# Patient Record
Sex: Female | Born: 1975 | Race: White | Hispanic: No | Marital: Married | Smoking: Never smoker
Health system: Southern US, Community
[De-identification: ages and names within clinical notes are randomized; demographics above are authoritative.]

## PROBLEM LIST (undated history)

## (undated) DIAGNOSIS — K900 Celiac disease: Secondary | ICD-10-CM

## (undated) DIAGNOSIS — T7840XA Allergy, unspecified, initial encounter: Secondary | ICD-10-CM

## (undated) DIAGNOSIS — R000 Tachycardia, unspecified: Secondary | ICD-10-CM

## (undated) HISTORY — DX: Tachycardia, unspecified: R00.0

## (undated) HISTORY — DX: Allergy, unspecified, initial encounter: T78.40XA

## (undated) HISTORY — DX: Celiac disease: K90.0

---

## 2006-09-16 ENCOUNTER — Other Ambulatory Visit: Admission: RE | Admit: 2006-09-16 | Discharge: 2006-09-16 | Payer: Self-pay | Admitting: Obstetrics and Gynecology

## 2007-09-19 ENCOUNTER — Other Ambulatory Visit: Admission: RE | Admit: 2007-09-19 | Discharge: 2007-09-19 | Payer: Self-pay | Admitting: Obstetrics and Gynecology

## 2008-09-24 ENCOUNTER — Other Ambulatory Visit: Admission: RE | Admit: 2008-09-24 | Discharge: 2008-09-24 | Payer: Self-pay | Admitting: Obstetrics and Gynecology

## 2009-10-11 ENCOUNTER — Other Ambulatory Visit: Admission: RE | Admit: 2009-10-11 | Discharge: 2009-10-11 | Payer: Self-pay | Admitting: Obstetrics and Gynecology

## 2010-10-13 ENCOUNTER — Other Ambulatory Visit: Admission: RE | Admit: 2010-10-13 | Discharge: 2010-10-13 | Payer: Self-pay | Admitting: Obstetrics and Gynecology

## 2011-10-15 ENCOUNTER — Other Ambulatory Visit (HOSPITAL_COMMUNITY)
Admission: RE | Admit: 2011-10-15 | Discharge: 2011-10-15 | Disposition: A | Discharge status: Home / Self Care | Payer: BC Managed Care – PPO | Source: Ambulatory Visit | Attending: Obstetrics and Gynecology | Admitting: Obstetrics and Gynecology

## 2011-10-15 ENCOUNTER — Other Ambulatory Visit: Payer: Self-pay | Admitting: Obstetrics and Gynecology

## 2011-10-15 DIAGNOSIS — Z01419 Encounter for gynecological examination (general) (routine) without abnormal findings: Secondary | ICD-10-CM | POA: Insufficient documentation

## 2012-10-18 ENCOUNTER — Other Ambulatory Visit (HOSPITAL_COMMUNITY)
Admission: RE | Admit: 2012-10-18 | Discharge: 2012-10-18 | Disposition: A | Discharge status: Home / Self Care | Payer: BC Managed Care – PPO | Source: Ambulatory Visit | Attending: Obstetrics and Gynecology | Admitting: Obstetrics and Gynecology

## 2012-10-18 ENCOUNTER — Other Ambulatory Visit: Payer: Self-pay | Admitting: Obstetrics and Gynecology

## 2012-10-18 DIAGNOSIS — Z01419 Encounter for gynecological examination (general) (routine) without abnormal findings: Secondary | ICD-10-CM | POA: Insufficient documentation

## 2013-11-17 ENCOUNTER — Other Ambulatory Visit (HOSPITAL_COMMUNITY)
Admission: RE | Admit: 2013-11-17 | Discharge: 2013-11-17 | Disposition: A | Discharge status: Home / Self Care | Payer: BC Managed Care – PPO | Source: Ambulatory Visit | Attending: Obstetrics and Gynecology | Admitting: Obstetrics and Gynecology

## 2013-11-17 ENCOUNTER — Other Ambulatory Visit: Payer: Self-pay | Admitting: Obstetrics and Gynecology

## 2013-11-17 DIAGNOSIS — Z01419 Encounter for gynecological examination (general) (routine) without abnormal findings: Secondary | ICD-10-CM | POA: Insufficient documentation

## 2013-11-17 DIAGNOSIS — Z1151 Encounter for screening for human papillomavirus (HPV): Secondary | ICD-10-CM | POA: Insufficient documentation

## 2014-11-16 ENCOUNTER — Other Ambulatory Visit: Payer: Self-pay | Admitting: Obstetrics and Gynecology

## 2014-11-16 ENCOUNTER — Other Ambulatory Visit (HOSPITAL_COMMUNITY)
Admission: RE | Admit: 2014-11-16 | Discharge: 2014-11-16 | Disposition: A | Discharge status: Home / Self Care | Payer: BC Managed Care – PPO | Source: Ambulatory Visit | Attending: Obstetrics and Gynecology | Admitting: Obstetrics and Gynecology

## 2014-11-16 DIAGNOSIS — Z01419 Encounter for gynecological examination (general) (routine) without abnormal findings: Secondary | ICD-10-CM | POA: Insufficient documentation

## 2014-11-19 LAB — CYTOLOGY - PAP

## 2015-01-07 ENCOUNTER — Other Ambulatory Visit: Payer: Self-pay

## 2015-01-07 DIAGNOSIS — Z1231 Encounter for screening mammogram for malignant neoplasm of breast: Secondary | ICD-10-CM

## 2015-02-04 ENCOUNTER — Ambulatory Visit
Admission: RE | Admit: 2015-02-04 | Discharge: 2015-02-04 | Disposition: A | Discharge status: Home / Self Care | Payer: BLUE CROSS/BLUE SHIELD | Source: Ambulatory Visit

## 2015-02-04 ENCOUNTER — Other Ambulatory Visit: Payer: Self-pay

## 2015-02-04 DIAGNOSIS — Z1231 Encounter for screening mammogram for malignant neoplasm of breast: Secondary | ICD-10-CM

## 2015-02-06 ENCOUNTER — Other Ambulatory Visit: Payer: Self-pay | Admitting: Obstetrics and Gynecology

## 2015-02-06 ENCOUNTER — Ambulatory Visit: Payer: BC Managed Care – PPO

## 2015-02-06 DIAGNOSIS — R928 Other abnormal and inconclusive findings on diagnostic imaging of breast: Secondary | ICD-10-CM

## 2015-02-13 ENCOUNTER — Ambulatory Visit
Admission: RE | Admit: 2015-02-13 | Discharge: 2015-02-13 | Disposition: A | Discharge status: Home / Self Care | Payer: BC Managed Care – PPO | Source: Ambulatory Visit | Attending: Obstetrics and Gynecology | Admitting: Obstetrics and Gynecology

## 2015-02-13 DIAGNOSIS — R928 Other abnormal and inconclusive findings on diagnostic imaging of breast: Secondary | ICD-10-CM

## 2015-11-22 ENCOUNTER — Other Ambulatory Visit (HOSPITAL_COMMUNITY)
Admission: RE | Admit: 2015-11-22 | Discharge: 2015-11-22 | Disposition: A | Discharge status: Home / Self Care | Payer: BC Managed Care – PPO | Source: Ambulatory Visit | Attending: Obstetrics and Gynecology | Admitting: Obstetrics and Gynecology

## 2015-11-22 ENCOUNTER — Other Ambulatory Visit: Payer: Self-pay | Admitting: Obstetrics and Gynecology

## 2015-11-22 DIAGNOSIS — Z01419 Encounter for gynecological examination (general) (routine) without abnormal findings: Secondary | ICD-10-CM | POA: Insufficient documentation

## 2015-11-25 LAB — CYTOLOGY - PAP

## 2016-01-07 ENCOUNTER — Other Ambulatory Visit: Payer: Self-pay

## 2016-01-07 DIAGNOSIS — Z1231 Encounter for screening mammogram for malignant neoplasm of breast: Secondary | ICD-10-CM

## 2016-02-12 ENCOUNTER — Ambulatory Visit
Admission: RE | Admit: 2016-02-12 | Discharge: 2016-02-12 | Disposition: A | Discharge status: Home / Self Care | Payer: BC Managed Care – PPO | Source: Ambulatory Visit

## 2016-02-12 DIAGNOSIS — Z1231 Encounter for screening mammogram for malignant neoplasm of breast: Secondary | ICD-10-CM

## 2016-11-30 ENCOUNTER — Other Ambulatory Visit: Payer: Self-pay | Admitting: Obstetrics and Gynecology

## 2016-11-30 ENCOUNTER — Other Ambulatory Visit (HOSPITAL_COMMUNITY)
Admission: RE | Admit: 2016-11-30 | Discharge: 2016-11-30 | Disposition: A | Discharge status: Home / Self Care | Payer: BC Managed Care – PPO | Source: Ambulatory Visit | Attending: Obstetrics and Gynecology | Admitting: Obstetrics and Gynecology

## 2016-11-30 DIAGNOSIS — Z01419 Encounter for gynecological examination (general) (routine) without abnormal findings: Secondary | ICD-10-CM | POA: Insufficient documentation

## 2016-11-30 DIAGNOSIS — Z1151 Encounter for screening for human papillomavirus (HPV): Secondary | ICD-10-CM | POA: Diagnosis not present

## 2016-12-03 LAB — CYTOLOGY - PAP
Diagnosis: NEGATIVE
HPV: NOT DETECTED

## 2017-01-20 ENCOUNTER — Other Ambulatory Visit: Payer: Self-pay | Admitting: Obstetrics and Gynecology

## 2017-01-20 DIAGNOSIS — Z1231 Encounter for screening mammogram for malignant neoplasm of breast: Secondary | ICD-10-CM

## 2017-02-17 ENCOUNTER — Ambulatory Visit
Admission: RE | Admit: 2017-02-17 | Discharge: 2017-02-17 | Disposition: A | Discharge status: Home / Self Care | Payer: BC Managed Care – PPO | Source: Ambulatory Visit | Attending: Obstetrics and Gynecology | Admitting: Obstetrics and Gynecology

## 2017-02-17 DIAGNOSIS — Z1231 Encounter for screening mammogram for malignant neoplasm of breast: Secondary | ICD-10-CM

## 2018-02-01 ENCOUNTER — Other Ambulatory Visit: Payer: Self-pay | Admitting: Obstetrics and Gynecology

## 2018-02-01 DIAGNOSIS — Z1231 Encounter for screening mammogram for malignant neoplasm of breast: Secondary | ICD-10-CM

## 2018-03-02 ENCOUNTER — Ambulatory Visit
Admission: RE | Admit: 2018-03-02 | Discharge: 2018-03-02 | Disposition: A | Discharge status: Home / Self Care | Payer: BC Managed Care – PPO | Source: Ambulatory Visit | Attending: Obstetrics and Gynecology | Admitting: Obstetrics and Gynecology

## 2018-03-02 DIAGNOSIS — Z1231 Encounter for screening mammogram for malignant neoplasm of breast: Secondary | ICD-10-CM

## 2018-03-03 ENCOUNTER — Other Ambulatory Visit: Payer: Self-pay | Admitting: Obstetrics and Gynecology

## 2018-03-03 DIAGNOSIS — R928 Other abnormal and inconclusive findings on diagnostic imaging of breast: Secondary | ICD-10-CM

## 2018-03-09 ENCOUNTER — Ambulatory Visit
Admission: RE | Admit: 2018-03-09 | Discharge: 2018-03-09 | Disposition: A | Discharge status: Home / Self Care | Payer: BC Managed Care – PPO | Source: Ambulatory Visit | Attending: Obstetrics and Gynecology | Admitting: Obstetrics and Gynecology

## 2018-03-09 DIAGNOSIS — R928 Other abnormal and inconclusive findings on diagnostic imaging of breast: Secondary | ICD-10-CM

## 2019-01-24 ENCOUNTER — Other Ambulatory Visit: Payer: Self-pay | Admitting: Obstetrics and Gynecology

## 2019-01-24 DIAGNOSIS — Z1231 Encounter for screening mammogram for malignant neoplasm of breast: Secondary | ICD-10-CM

## 2019-03-15 ENCOUNTER — Ambulatory Visit: Payer: BC Managed Care – PPO

## 2019-05-10 ENCOUNTER — Other Ambulatory Visit: Payer: Self-pay

## 2019-05-10 ENCOUNTER — Ambulatory Visit
Admission: RE | Admit: 2019-05-10 | Discharge: 2019-05-10 | Disposition: A | Discharge status: Home / Self Care | Payer: BC Managed Care – PPO | Source: Ambulatory Visit | Attending: Obstetrics and Gynecology | Admitting: Obstetrics and Gynecology

## 2019-05-10 DIAGNOSIS — Z1231 Encounter for screening mammogram for malignant neoplasm of breast: Secondary | ICD-10-CM

## 2019-05-12 ENCOUNTER — Other Ambulatory Visit: Payer: Self-pay | Admitting: Obstetrics and Gynecology

## 2019-05-12 DIAGNOSIS — R928 Other abnormal and inconclusive findings on diagnostic imaging of breast: Secondary | ICD-10-CM

## 2019-05-18 ENCOUNTER — Ambulatory Visit
Admission: RE | Admit: 2019-05-18 | Discharge: 2019-05-18 | Disposition: A | Discharge status: Home / Self Care | Payer: BC Managed Care – PPO | Source: Ambulatory Visit | Attending: Obstetrics and Gynecology | Admitting: Obstetrics and Gynecology

## 2019-05-18 ENCOUNTER — Other Ambulatory Visit: Payer: Self-pay | Admitting: Obstetrics and Gynecology

## 2019-05-18 ENCOUNTER — Other Ambulatory Visit: Payer: Self-pay

## 2019-05-18 DIAGNOSIS — R928 Other abnormal and inconclusive findings on diagnostic imaging of breast: Secondary | ICD-10-CM

## 2019-05-18 DIAGNOSIS — N6320 Unspecified lump in the left breast, unspecified quadrant: Secondary | ICD-10-CM

## 2019-10-16 ENCOUNTER — Encounter: Payer: Self-pay | Admitting: Gastroenterology

## 2019-11-15 ENCOUNTER — Other Ambulatory Visit: Payer: Self-pay

## 2019-11-15 ENCOUNTER — Ambulatory Visit
Admission: RE | Admit: 2019-11-15 | Discharge: 2019-11-15 | Disposition: A | Discharge status: Home / Self Care | Payer: BC Managed Care – PPO | Source: Ambulatory Visit | Attending: Obstetrics and Gynecology | Admitting: Obstetrics and Gynecology

## 2019-11-15 DIAGNOSIS — N6320 Unspecified lump in the left breast, unspecified quadrant: Secondary | ICD-10-CM

## 2019-11-20 ENCOUNTER — Other Ambulatory Visit: Payer: BC Managed Care – PPO

## 2020-04-04 ENCOUNTER — Other Ambulatory Visit: Payer: Self-pay | Admitting: Obstetrics and Gynecology

## 2020-04-04 DIAGNOSIS — Z1231 Encounter for screening mammogram for malignant neoplasm of breast: Secondary | ICD-10-CM

## 2020-05-10 ENCOUNTER — Ambulatory Visit: Payer: BC Managed Care – PPO

## 2020-05-16 ENCOUNTER — Ambulatory Visit
Admission: RE | Admit: 2020-05-16 | Discharge: 2020-05-16 | Disposition: A | Discharge status: Home / Self Care | Payer: BC Managed Care – PPO | Source: Ambulatory Visit | Attending: Obstetrics and Gynecology | Admitting: Obstetrics and Gynecology

## 2020-05-16 ENCOUNTER — Other Ambulatory Visit: Payer: Self-pay

## 2020-05-16 DIAGNOSIS — Z1231 Encounter for screening mammogram for malignant neoplasm of breast: Secondary | ICD-10-CM

## 2020-05-20 ENCOUNTER — Other Ambulatory Visit: Payer: Self-pay | Admitting: Obstetrics and Gynecology

## 2020-05-20 DIAGNOSIS — R928 Other abnormal and inconclusive findings on diagnostic imaging of breast: Secondary | ICD-10-CM

## 2020-05-21 ENCOUNTER — Ambulatory Visit: Payer: BC Managed Care – PPO

## 2020-05-29 ENCOUNTER — Ambulatory Visit
Admission: RE | Admit: 2020-05-29 | Discharge: 2020-05-29 | Disposition: A | Discharge status: Home / Self Care | Payer: BC Managed Care – PPO | Source: Ambulatory Visit | Attending: Obstetrics and Gynecology | Admitting: Obstetrics and Gynecology

## 2020-05-29 ENCOUNTER — Other Ambulatory Visit: Payer: Self-pay

## 2020-05-29 ENCOUNTER — Other Ambulatory Visit: Payer: Self-pay | Admitting: Obstetrics and Gynecology

## 2020-05-29 DIAGNOSIS — R928 Other abnormal and inconclusive findings on diagnostic imaging of breast: Secondary | ICD-10-CM

## 2020-11-28 ENCOUNTER — Other Ambulatory Visit: Payer: Self-pay

## 2020-11-28 ENCOUNTER — Other Ambulatory Visit: Payer: BC Managed Care – PPO

## 2020-11-28 ENCOUNTER — Ambulatory Visit
Admission: RE | Admit: 2020-11-28 | Discharge: 2020-11-28 | Disposition: A | Discharge status: Home / Self Care | Payer: BC Managed Care – PPO | Source: Ambulatory Visit | Attending: Obstetrics and Gynecology | Admitting: Obstetrics and Gynecology

## 2020-11-28 DIAGNOSIS — R928 Other abnormal and inconclusive findings on diagnostic imaging of breast: Secondary | ICD-10-CM

## 2021-02-04 ENCOUNTER — Encounter: Payer: Self-pay | Admitting: Gastroenterology

## 2021-04-01 ENCOUNTER — Encounter: Payer: Self-pay | Admitting: Gastroenterology

## 2021-04-02 ENCOUNTER — Other Ambulatory Visit: Payer: Self-pay | Admitting: Obstetrics and Gynecology

## 2021-04-02 DIAGNOSIS — Z1231 Encounter for screening mammogram for malignant neoplasm of breast: Secondary | ICD-10-CM

## 2021-05-14 ENCOUNTER — Other Ambulatory Visit: Payer: Self-pay

## 2021-05-14 ENCOUNTER — Ambulatory Visit (AMBULATORY_SURGERY_CENTER): Payer: BC Managed Care – PPO

## 2021-05-14 VITALS — Ht 62.0 in | Wt 118.0 lb

## 2021-05-14 DIAGNOSIS — Z1211 Encounter for screening for malignant neoplasm of colon: Secondary | ICD-10-CM

## 2021-05-14 MED ORDER — CLENPIQ 10-3.5-12 MG-GM -GM/160ML PO SOLN: 1.0000 | ORAL | 0 refills | Status: DC

## 2021-05-14 NOTE — Progress Notes (Signed)
Patient's pre-visit was done today over the phone with the patient due to COVID-19 pandemic. Name,DOB and address verified. Insurance verified. Patient denies any allergies to Eggs and Soy. Patient denies any problems with anesthesia/sedation. Patient denies taking diet pills or blood thinners. Packet of Prep instructions mailed to patient including a copy of a consent form-pt is aware.  Clenpiq  Prep coupon included. Patient understands to call us back with any questions or concerns. Patient is aware of our care-partner policy and [id number] safety protocol.   The patient is COVID-19 vaccinated, per patient.   Pt was asked to email Trenton Gammon a copy of her insurance card front and back.  Pt said she will send tomorrow.

## 2021-05-21 ENCOUNTER — Other Ambulatory Visit: Payer: Self-pay

## 2021-05-21 ENCOUNTER — Ambulatory Visit
Admission: RE | Admit: 2021-05-21 | Discharge: 2021-05-21 | Disposition: A | Discharge status: Home / Self Care | Payer: BC Managed Care – PPO | Source: Ambulatory Visit | Attending: Obstetrics and Gynecology | Admitting: Obstetrics and Gynecology

## 2021-05-21 DIAGNOSIS — Z1231 Encounter for screening mammogram for malignant neoplasm of breast: Secondary | ICD-10-CM

## 2021-05-23 ENCOUNTER — Other Ambulatory Visit: Payer: Self-pay | Admitting: Obstetrics and Gynecology

## 2021-05-23 DIAGNOSIS — R928 Other abnormal and inconclusive findings on diagnostic imaging of breast: Secondary | ICD-10-CM

## 2021-05-28 ENCOUNTER — Encounter: Payer: Self-pay | Admitting: Gastroenterology

## 2021-05-28 ENCOUNTER — Ambulatory Visit (AMBULATORY_SURGERY_CENTER): Payer: BC Managed Care – PPO | Admitting: Gastroenterology

## 2021-05-28 ENCOUNTER — Other Ambulatory Visit: Payer: Self-pay

## 2021-05-28 VITALS — BP 101/62 | HR 67 | Temp 98.9°F | Resp 16 | Ht 62.5 in | Wt 118.0 lb

## 2021-05-28 DIAGNOSIS — Z1211 Encounter for screening for malignant neoplasm of colon: Secondary | ICD-10-CM

## 2021-05-28 DIAGNOSIS — D124 Benign neoplasm of descending colon: Secondary | ICD-10-CM

## 2021-05-28 DIAGNOSIS — D125 Benign neoplasm of sigmoid colon: Secondary | ICD-10-CM

## 2021-05-28 MED ORDER — SODIUM CHLORIDE 0.9 % IV SOLN
500.0000 mL | Freq: Once | INTRAVENOUS | Status: DC
Start: 2021-05-28 — End: 2021-05-28

## 2021-05-28 NOTE — Op Note (Signed)
[facility] Patient Name: Amy Byrd Procedure Date: 05/28/2021 9:46 AM MRN: [id number] Endoscopist: Jackquline Denmark , MD Age: 45 Referring MD:  Date of Birth: [birth year] Gender: Female Account #: [id number] Procedure:                Colonoscopy Indications:              Screening for colorectal malignant neoplasm Medicines:                Monitored Anesthesia Care Procedure:                Pre-Anesthesia Assessment:                           - Prior to the procedure, a History and Physical                            was performed, and patient medications and                            allergies were reviewed. The patient's tolerance of                            previous anesthesia was also reviewed. The risks                            and benefits of the procedure and the sedation                            options and risks were discussed with the patient.                            All questions were answered, and informed consent                            was obtained. Prior Anticoagulants: The patient has                            taken no previous anticoagulant or antiplatelet                            agents. ASA Grade Assessment: I - A normal, healthy                            patient. After reviewing the risks and benefits,                            the patient was deemed in satisfactory condition to                            undergo the procedure.                           After obtaining informed consent, the colonoscope  was passed under direct vision. Throughout the                            procedure, the patient's blood pressure, pulse, and                            oxygen saturations were monitored continuously. The                            Olympus PCF-H190DL (#[id number]) Colonoscope was                            introduced through the anus and advanced to the 2                            cm into the ileum. The colonoscopy  was performed                            without difficulty. The patient tolerated the                            procedure well. The quality of the bowel                            preparation was good. The terminal ileum, ileocecal                            valve, appendiceal orifice, and rectum were                            photographed. Scope In: 9:50:16 AM Scope Out: 10:03:40 AM Scope Withdrawal Time: 0 hours 8 minutes 2 seconds  Total Procedure Duration: 0 hours 13 minutes 24 seconds  Findings:                 Two sessile polyps were found in the distal sigmoid                            colon and mid descending colon. The polyps were 2                            to 4 mm in size. These polyps were removed with a                            cold snare. Resection and retrieval were complete.                           Non-bleeding internal hemorrhoids were found during                            retroflexion. The hemorrhoids were small.                           The terminal ileum appeared normal.  The exam was otherwise without abnormality on                            direct and retroflexion views. Complications:            No immediate complications. Estimated Blood Loss:     Estimated blood loss: none. Impression:               - Two 2 to 4 mm polyps in the distal sigmoid colon                            and in the mid descending colon, removed with a                            cold snare. Resected and retrieved.                           - Non-bleeding internal hemorrhoids.                           - The examined portion of the ileum was normal.                           - The examination was otherwise normal on direct                            and retroflexion views. Recommendation:           - Patient has a contact number available for                            emergencies. The signs and symptoms of potential                            delayed  complications were discussed with the                            patient. Return to normal activities tomorrow.                            Written discharge instructions were provided to the                            patient.                           - Resume previous diet.                           - Continue present medications.                           - Await pathology results.                           - Repeat colonoscopy for surveillance based on  pathology results.                           - The findings and recommendations were discussed                            with the patient's family. Jackquline Denmark, MD 05/28/2021 10:06:48 AM This report has been signed electronically.

## 2021-05-28 NOTE — Progress Notes (Signed)
PT taken to PACU. Monitors in place. VSS. Report given to RN. 

## 2021-05-28 NOTE — Patient Instructions (Signed)
Read all of the handouts given to you by your recovery room nurse.  YOU HAD AN ENDOSCOPIC PROCEDURE TODAY AT THE [facility]:   Refer to the procedure report that was given to you for any specific questions about what was found during the examination.  If the procedure report does not answer your questions, please call your gastroenterologist to clarify.  If you requested that your care partner not be given the details of your procedure findings, then the procedure report has been included in a sealed envelope for you to review at your convenience later.  YOU SHOULD EXPECT: Some feelings of bloating in the abdomen. Passage of more gas than usual.  Walking can help get rid of the air that was put into your GI tract during the procedure and reduce the bloating. If you had a lower endoscopy (such as a colonoscopy or flexible sigmoidoscopy) you may notice spotting of blood in your stool or on the toilet paper. If you underwent a bowel prep for your procedure, you may not have a normal bowel movement for a few days.  Please Note:  You might notice some irritation and congestion in your nose or some drainage.  This is from the oxygen used during your procedure.  There is no need for concern and it should clear up in a day or so.  SYMPTOMS TO REPORT IMMEDIATELY:  Following lower endoscopy (colonoscopy or flexible sigmoidoscopy):  Excessive amounts of blood in the stool  Significant tenderness or worsening of abdominal pains  Swelling of the abdomen that is new, acute  Fever of 100F or higher   For urgent or emergent issues, a gastroenterologist can be reached at any hour by calling [phone]. Do not use MyChart messaging for urgent concerns.    DIET:  We do recommend a small meal at first, but then you may proceed to your regular diet.  Drink plenty of fluids but you should avoid alcoholic beverages for 24 hours. Try to increase the fiber in your diet, and drink plenty of  water.  ACTIVITY:  You should plan to take it easy for the rest of today and you should NOT DRIVE or use heavy machinery until tomorrow (because of the sedation medicines used during the test).    FOLLOW UP: Our staff will call the number listed on your records 48-72 hours following your procedure to check on you and address any questions or concerns that you may have regarding the information given to you following your procedure. If we do not reach you, we will leave a message.  We will attempt to reach you two times.  During this call, we will ask if you have developed any symptoms of COVID 19. If you develop any symptoms (ie: fever, flu-like symptoms, shortness of breath, cough etc.) before then, please call [phone].  If you test positive for Covid 19 in the 2 weeks post procedure, please call and report this information to us.    If any biopsies were taken you will be contacted by phone or by letter within the next 1-3 weeks.  Please call us at [phone] if you have not heard about the biopsies in 3 weeks.    SIGNATURES/CONFIDENTIALITY: You and/or your care partner have signed paperwork which will be entered into your electronic medical record.  These signatures attest to the fact that that the information above on your After Visit Summary has been reviewed and is understood.  Full responsibility of the confidentiality of this   discharge information lies with you and/or your care-partner.  

## 2021-05-28 NOTE — Progress Notes (Signed)
Called to room to assist during endoscopic procedure.  Patient ID and intended procedure confirmed with present staff. Received instructions for my participation in the procedure from the performing physician.  

## 2021-05-28 NOTE — Progress Notes (Signed)
Pt's states no medical or surgical changes since previsit or office visit.   VS taken by CW 

## 2021-05-30 ENCOUNTER — Telehealth: Payer: Self-pay

## 2021-05-30 NOTE — Telephone Encounter (Signed)
  Follow up Call-  Call back number 05/28/2021  Post procedure Call Back phone  # [phone]  Permission to leave phone message Yes  Some recent data might be hidden     Patient questions:  Do you have a fever, pain , or abdominal swelling? No. Pain Score  0 *  Have you tolerated food without any problems? Yes.    Have you been able to return to your normal activities? Yes.    Do you have any questions about your discharge instructions: Diet   No. Medications  No. Follow up visit  No.  Do you have questions or concerns about your Care? No.  Actions: * If pain score is 4 or above: No action needed, pain <4.  Have you developed a fever since your procedure? no  2.   Have you had an respiratory symptoms (SOB or cough) since your procedure? no  3.   Have you tested positive for COVID 19 since your procedure no  4.   Have you had any family members/close contacts diagnosed with the COVID 19 since your procedure?  no   If yes to any of these questions please route to Joylene John, RN and Joella Prince, RN

## 2021-06-04 ENCOUNTER — Other Ambulatory Visit: Payer: Self-pay | Admitting: Obstetrics and Gynecology

## 2021-06-04 ENCOUNTER — Other Ambulatory Visit: Payer: Self-pay

## 2021-06-04 ENCOUNTER — Ambulatory Visit
Admission: RE | Admit: 2021-06-04 | Discharge: 2021-06-04 | Disposition: A | Discharge status: Home / Self Care | Payer: BC Managed Care – PPO | Source: Ambulatory Visit | Attending: Obstetrics and Gynecology | Admitting: Obstetrics and Gynecology

## 2021-06-04 DIAGNOSIS — R928 Other abnormal and inconclusive findings on diagnostic imaging of breast: Secondary | ICD-10-CM

## 2021-06-09 ENCOUNTER — Encounter: Payer: Self-pay | Admitting: Gastroenterology

## 2021-12-04 ENCOUNTER — Ambulatory Visit
Admission: RE | Admit: 2021-12-04 | Discharge: 2021-12-04 | Disposition: A | Discharge status: Home / Self Care | Payer: BC Managed Care – PPO | Source: Ambulatory Visit | Attending: Obstetrics and Gynecology | Admitting: Obstetrics and Gynecology

## 2021-12-04 ENCOUNTER — Other Ambulatory Visit: Payer: Self-pay | Admitting: Obstetrics and Gynecology

## 2021-12-04 DIAGNOSIS — R928 Other abnormal and inconclusive findings on diagnostic imaging of breast: Secondary | ICD-10-CM

## 2022-06-11 ENCOUNTER — Ambulatory Visit
Admission: RE | Admit: 2022-06-11 | Discharge: 2022-06-11 | Disposition: A | Discharge status: Home / Self Care | Payer: BC Managed Care – PPO | Source: Ambulatory Visit | Attending: Obstetrics and Gynecology | Admitting: Obstetrics and Gynecology

## 2022-06-11 DIAGNOSIS — R928 Other abnormal and inconclusive findings on diagnostic imaging of breast: Secondary | ICD-10-CM

## 2023-03-19 ENCOUNTER — Other Ambulatory Visit: Payer: Self-pay | Admitting: Obstetrics and Gynecology

## 2023-03-19 ENCOUNTER — Other Ambulatory Visit (HOSPITAL_COMMUNITY)
Admission: RE | Admit: 2023-03-19 | Discharge: 2023-03-19 | Disposition: A | Discharge status: Home / Self Care | Payer: BC Managed Care – PPO | Source: Ambulatory Visit | Attending: Obstetrics and Gynecology | Admitting: Obstetrics and Gynecology

## 2023-03-19 DIAGNOSIS — Z01419 Encounter for gynecological examination (general) (routine) without abnormal findings: Secondary | ICD-10-CM | POA: Insufficient documentation

## 2023-03-23 LAB — CYTOLOGY - PAP
Comment: NEGATIVE
Diagnosis: NEGATIVE
High risk HPV: NEGATIVE

## 2023-05-05 ENCOUNTER — Other Ambulatory Visit: Payer: Self-pay | Admitting: Obstetrics and Gynecology

## 2023-05-05 DIAGNOSIS — Z1231 Encounter for screening mammogram for malignant neoplasm of breast: Secondary | ICD-10-CM

## 2023-06-22 ENCOUNTER — Ambulatory Visit
Admission: RE | Admit: 2023-06-22 | Discharge: 2023-06-22 | Disposition: A | Discharge status: Home / Self Care | Payer: BC Managed Care – PPO | Source: Ambulatory Visit | Attending: Obstetrics and Gynecology | Admitting: Obstetrics and Gynecology

## 2023-06-22 DIAGNOSIS — Z1231 Encounter for screening mammogram for malignant neoplasm of breast: Secondary | ICD-10-CM

## 2023-12-04 IMAGING — US US BREAST*R* LIMITED INC AXILLA
1 series · 10 of 10 positions shown · non-contrast
Comparison: Previous exam(s).

CLINICAL DATA: 45-year-old female presenting for six-month
follow-up of probably benign right breast calcifications.

EXAM:
DIGITAL DIAGNOSTIC UNILATERAL RIGHT MAMMOGRAM WITH TOMOSYNTHESIS AND
CAD; ULTRASOUND RIGHT BREAST LIMITED
TECHNIQUE: Right digital diagnostic mammography and breast tomosynthesis was
performed. The images were evaluated with computer-aided detection.;
Targeted ultrasound examination of the right breast was performed

[Series 1: us breast*right* limited inc axilla · 0.06mm/px · 10 of 10 slices shown]
[im 1/10]
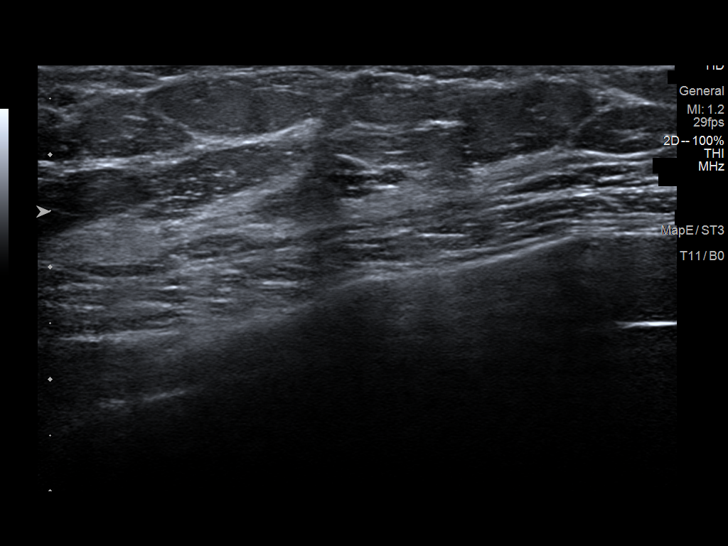
[im 2/10]
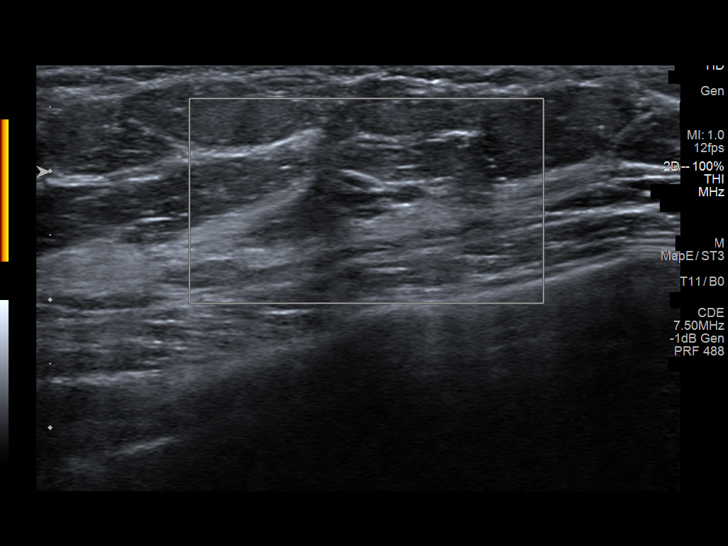
[im 3/10]
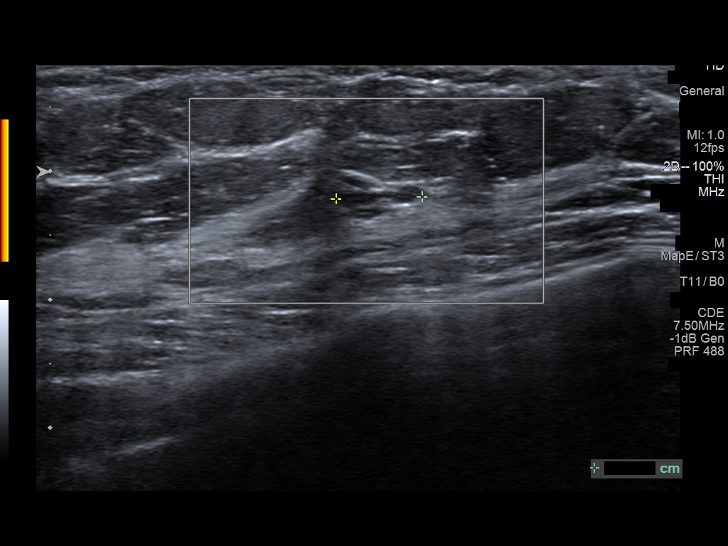
[im 4/10]
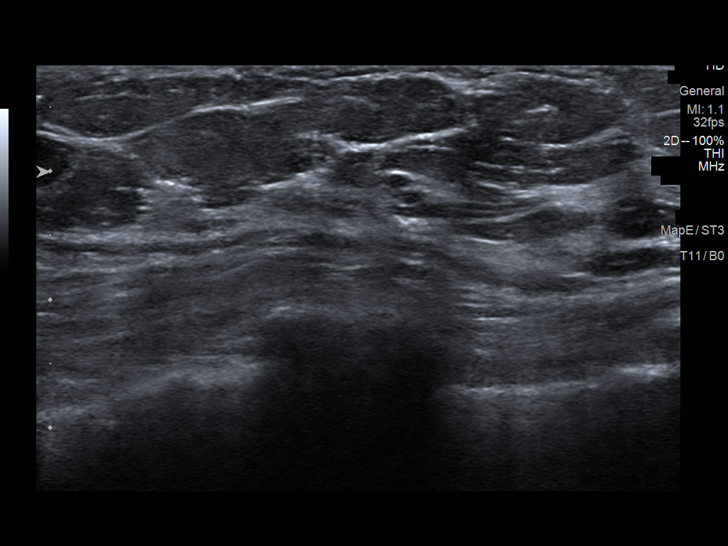
[im 5/10]
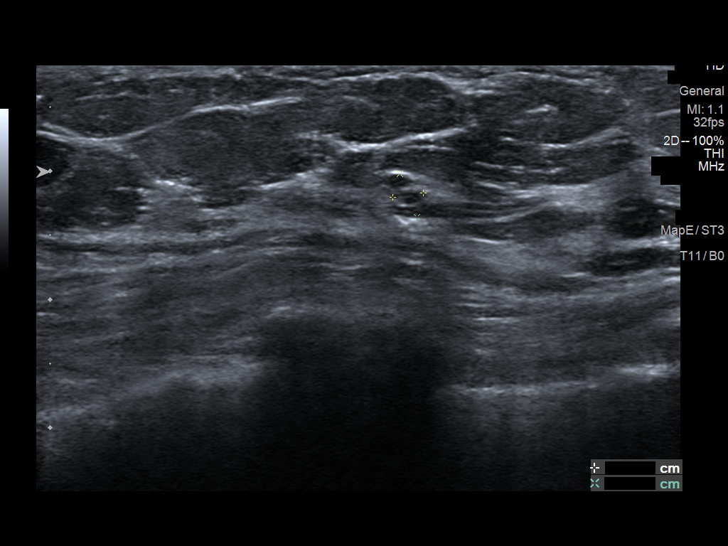
[im 6/10]
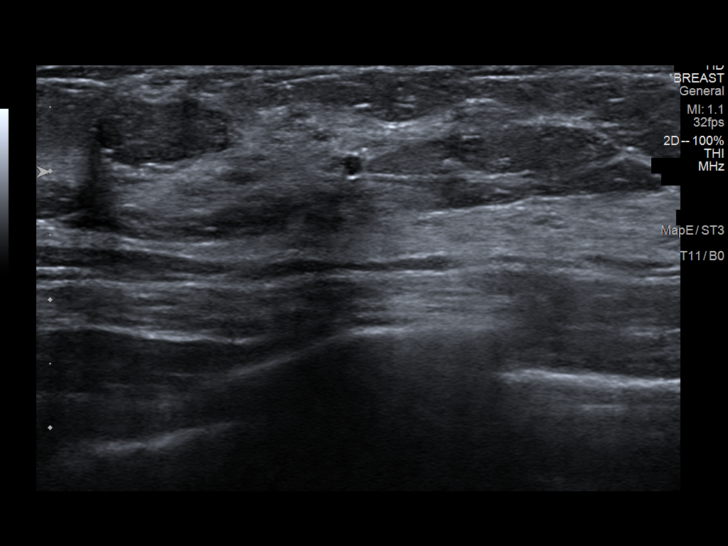
[im 7/10]
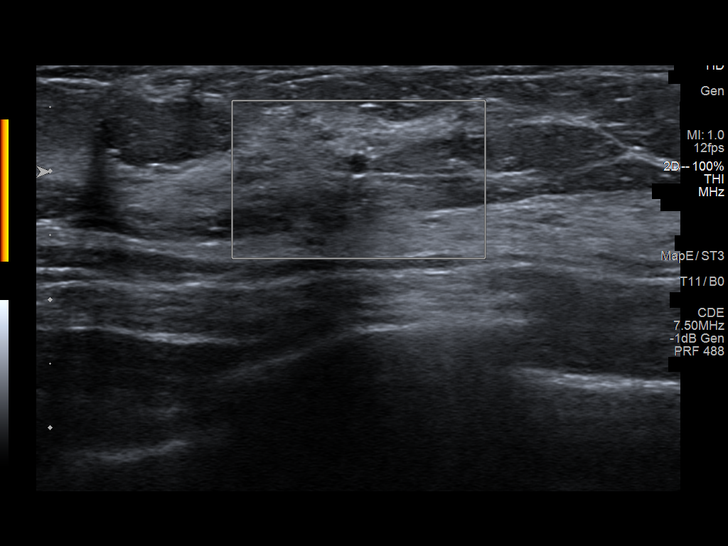
[im 8/10]
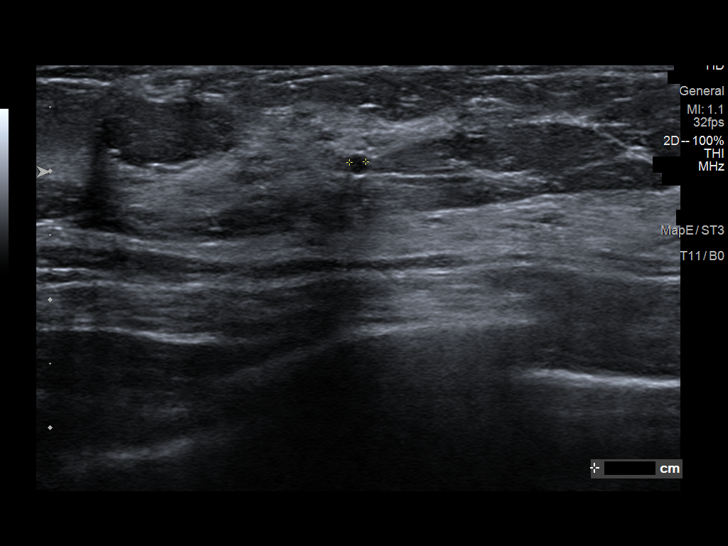
[im 9/10]
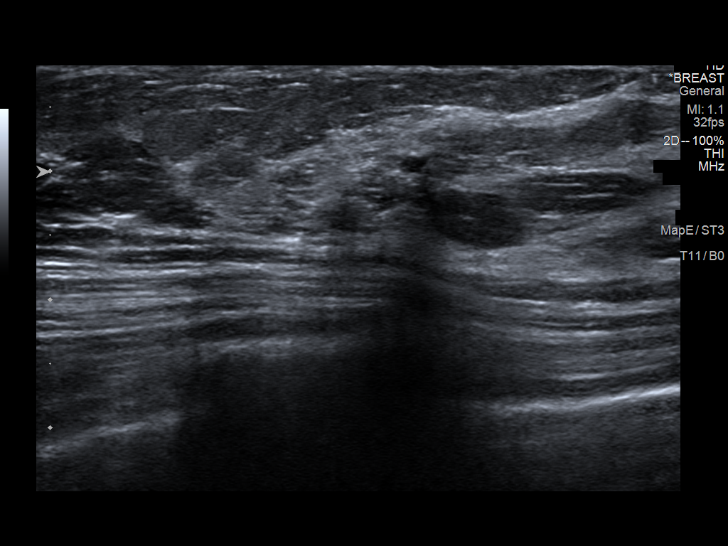
[im 10/10]
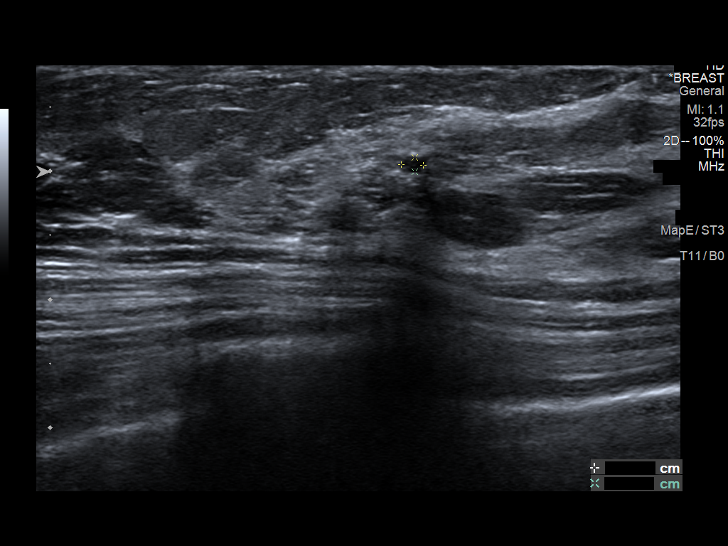

[10 of 10 positions shown; findings below may reference images not displayed]

ACR Breast Density Category c: The breast tissue is heterogeneously
dense, which may obscure small masses.
FINDINGS: Loosely grouped, punctate calcifications within the upper-outer
right breast are less conspicuous on today's studies. No other
suspicious findings identified.

Targeted ultrasound is performed, showing stable appearance of
hypoechoic masses at the 12 o'clock position 1 cm from the nipple
and 11 o'clock position 2 cm from the nipple. They measure 7 x 4 x 2
mm (previously 7 x 4 x 2 mm) and 3 x 2 x 2 mm (previously 3 x 2 x 2
mm).
IMPRESSION: 1. Probably benign right breast calcifications, less conspicuous
today.
2. Probably benign right breast masses, unchanged.

RECOMMENDATION:
Bilateral diagnostic mammogram and right breast ultrasound in 6
months.

I have discussed the findings and recommendations with the patient.
If applicable, a reminder letter will be sent to the patient
regarding the next appointment.

BI-RADS CATEGORY  3: Probably benign.

## 2023-12-04 IMAGING — MG MM DIGITAL DIAGNOSTIC UNILAT*R* W/ TOMO W/ CAD
7 series · 8 of 15 positions shown · non-contrast
Comparison: Previous exam(s).

CLINICAL DATA: 45-year-old female presenting for six-month
follow-up of probably benign right breast calcifications.

EXAM:
DIGITAL DIAGNOSTIC UNILATERAL RIGHT MAMMOGRAM WITH TOMOSYNTHESIS AND
CAD; ULTRASOUND RIGHT BREAST LIMITED
TECHNIQUE: Right digital diagnostic mammography and breast tomosynthesis was
performed. The images were evaluated with computer-aided detection.;
Targeted ultrasound examination of the right breast was performed

[R CC]
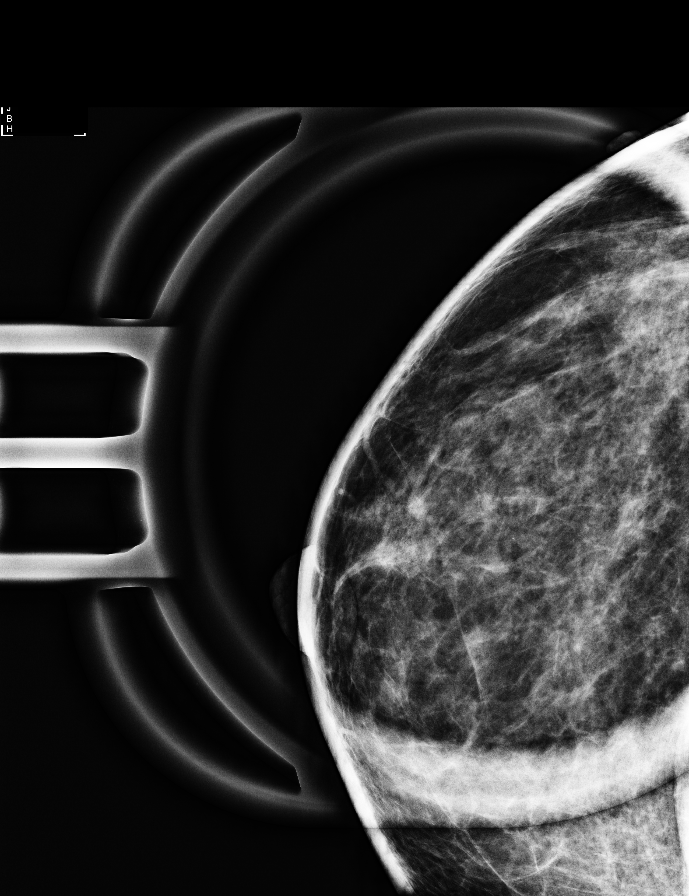

[R ML (1 of 2)]
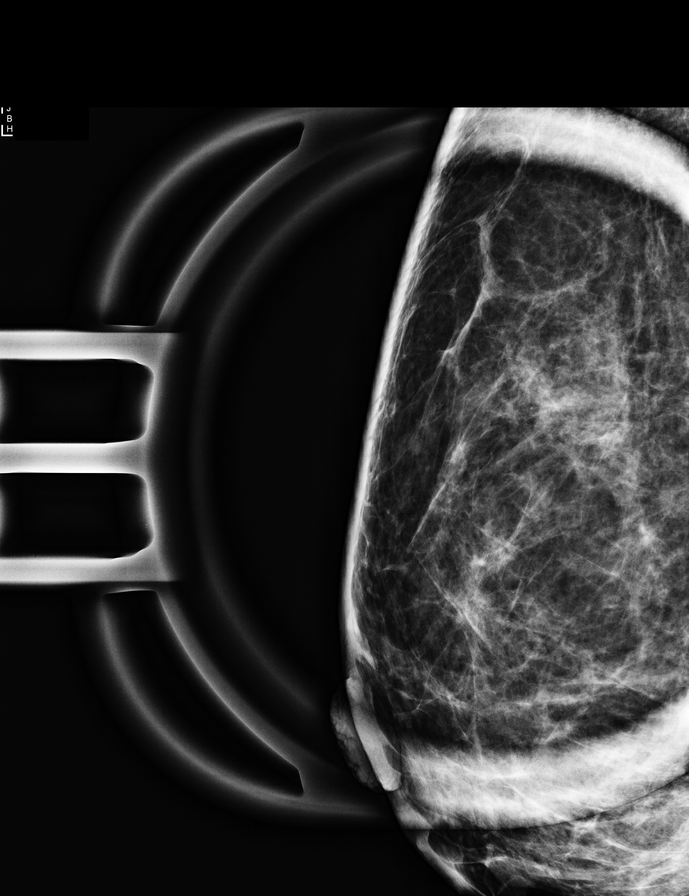

[R ML (2 of 2)]
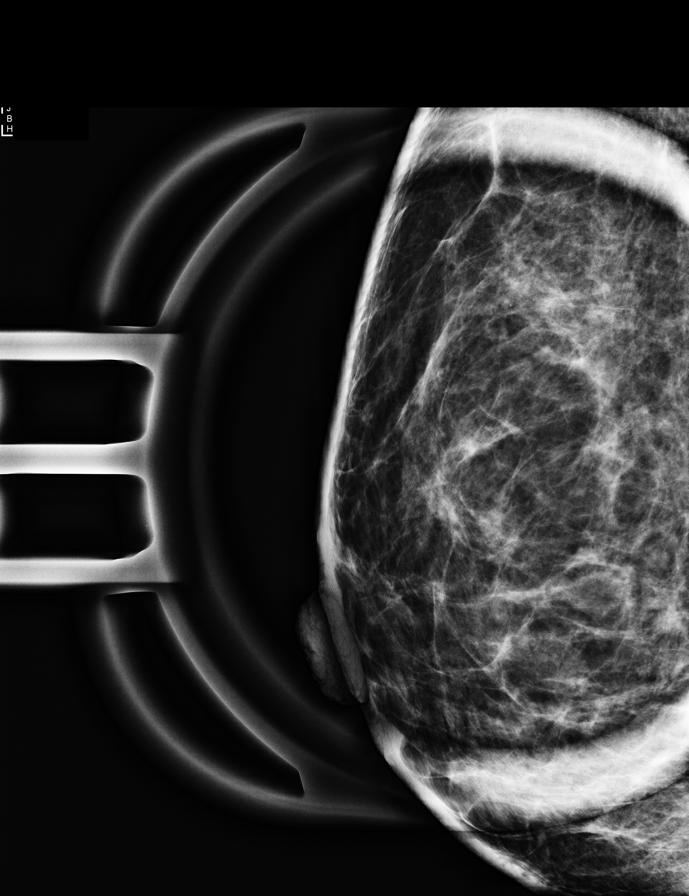

[R CC synth-2D]
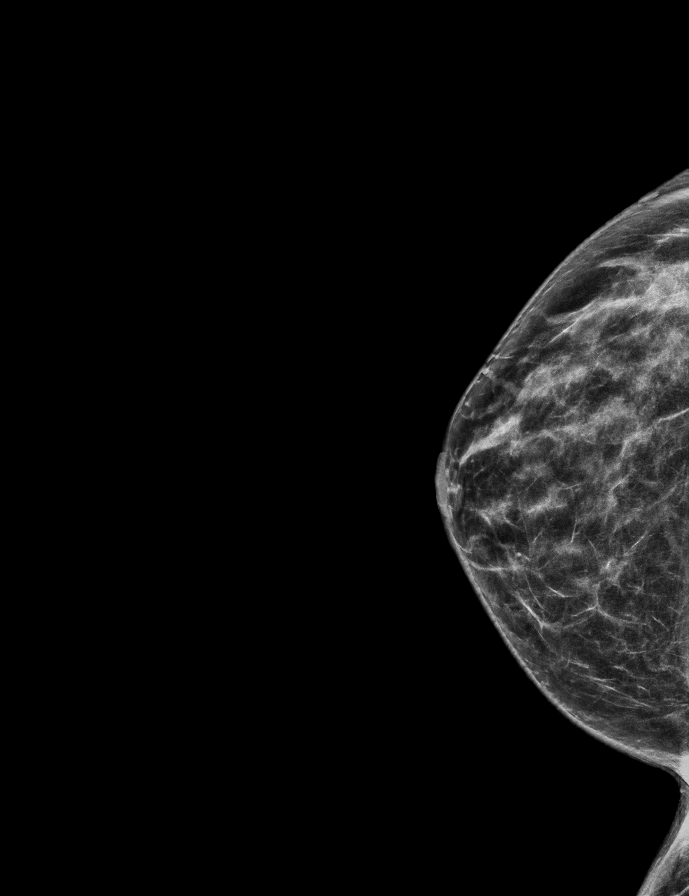

[R MLO synth-2D]
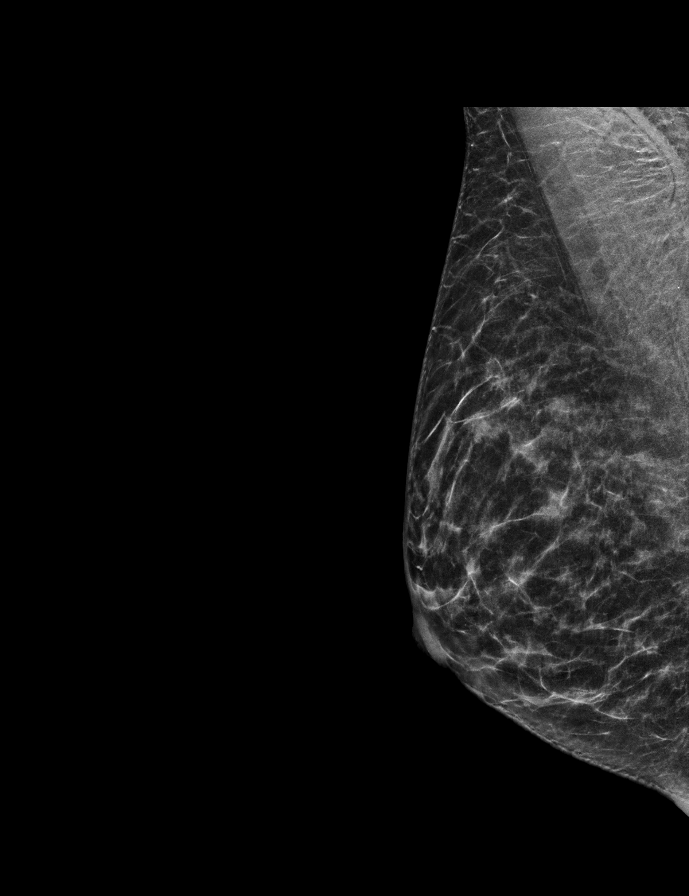

[R CC tomo · 2 of 56 frames shown]
[frame 19/56]
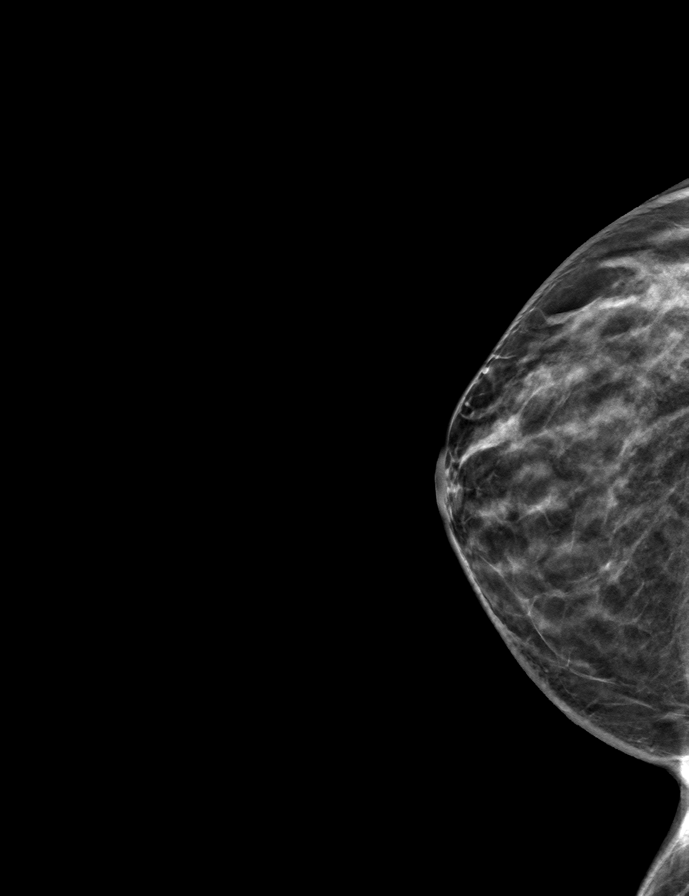
[frame 29/56]
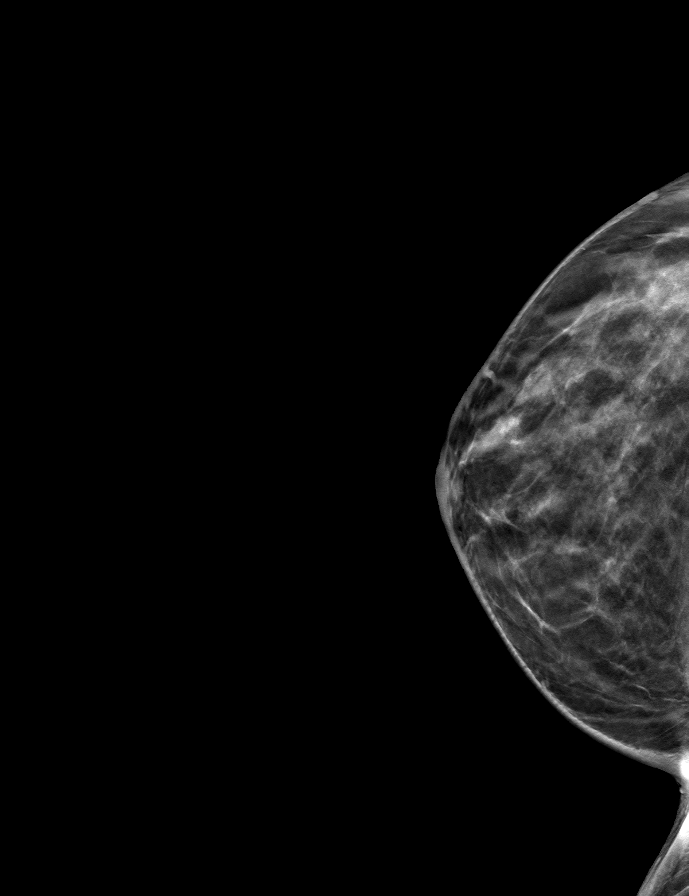

[R MLO tomo · tomo slice 26/51.0]
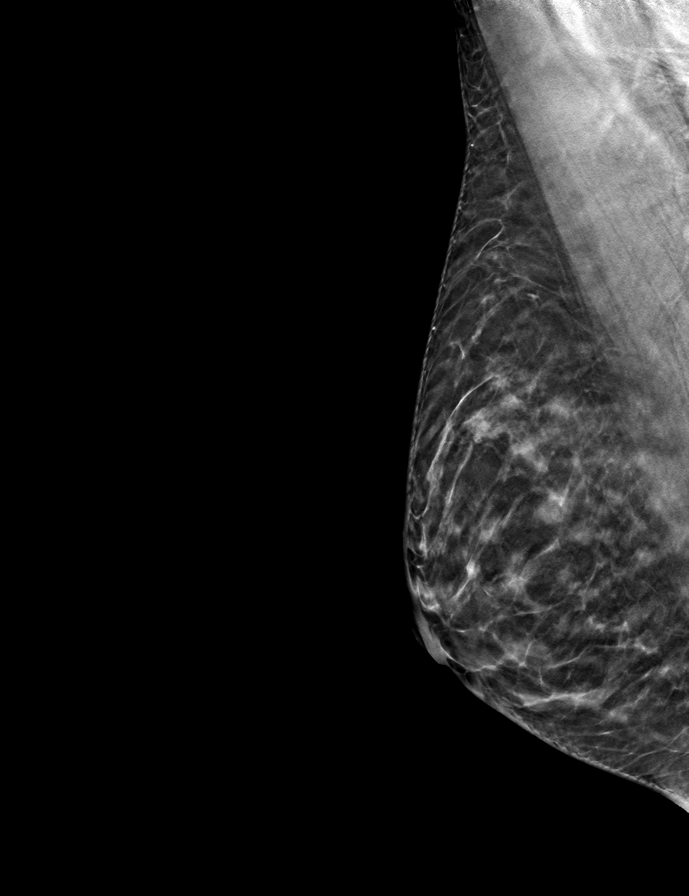

[8 of 15 positions shown; findings below may reference images not displayed]

ACR Breast Density Category c: The breast tissue is heterogeneously
dense, which may obscure small masses.
FINDINGS: Loosely grouped, punctate calcifications within the upper-outer
right breast are less conspicuous on today's studies. No other
suspicious findings identified.

Targeted ultrasound is performed, showing stable appearance of
hypoechoic masses at the 12 o'clock position 1 cm from the nipple
and 11 o'clock position 2 cm from the nipple. They measure 7 x 4 x 2
mm (previously 7 x 4 x 2 mm) and 3 x 2 x 2 mm (previously 3 x 2 x 2
mm).
IMPRESSION: 1. Probably benign right breast calcifications, less conspicuous
today.
2. Probably benign right breast masses, unchanged.

RECOMMENDATION:
Bilateral diagnostic mammogram and right breast ultrasound in 6
months.

I have discussed the findings and recommendations with the patient.
If applicable, a reminder letter will be sent to the patient
regarding the next appointment.

BI-RADS CATEGORY  3: Probably benign.

## 2024-05-11 ENCOUNTER — Other Ambulatory Visit: Payer: Self-pay | Admitting: Obstetrics and Gynecology

## 2024-05-11 DIAGNOSIS — Z1231 Encounter for screening mammogram for malignant neoplasm of breast: Secondary | ICD-10-CM

## 2024-05-25 ENCOUNTER — Ambulatory Visit (HOSPITAL_BASED_OUTPATIENT_CLINIC_OR_DEPARTMENT_OTHER)
Admission: RE | Admit: 2024-05-25 | Discharge: 2024-05-25 | Disposition: A | Discharge status: Home / Self Care | Source: Ambulatory Visit | Attending: Family Medicine

## 2024-05-25 ENCOUNTER — Ambulatory Visit (HOSPITAL_BASED_OUTPATIENT_CLINIC_OR_DEPARTMENT_OTHER)
Admit: 2024-05-25 | Discharge: 2024-05-25 | Disposition: A | Discharge status: Home / Self Care | Attending: Family Medicine | Admitting: Family Medicine

## 2024-05-25 ENCOUNTER — Encounter (HOSPITAL_BASED_OUTPATIENT_CLINIC_OR_DEPARTMENT_OTHER): Payer: Self-pay

## 2024-05-25 VITALS — BP 108/66 | HR 75 | Temp 98.3°F | Resp 20

## 2024-05-25 DIAGNOSIS — S99921A Unspecified injury of right foot, initial encounter: Secondary | ICD-10-CM

## 2024-05-25 DIAGNOSIS — M79671 Pain in right foot: Secondary | ICD-10-CM | POA: Diagnosis not present

## 2024-05-25 DIAGNOSIS — X501XXA Overexertion from prolonged static or awkward postures, initial encounter: Secondary | ICD-10-CM

## 2024-05-25 NOTE — ED Triage Notes (Signed)
 Twisted right foot while turning today. Pain to top of right foot.Patient able to ambulate but with discomfort.

## 2024-05-25 NOTE — Discharge Instructions (Signed)
 I am not seeing anything concerning on your x-ray. Most likely sprain.  You can use the Ace wrap for compression.,  Rest, ice, elevate and follow-up as needed I will call you if anything changes on the official results of your x-ray

## 2024-05-26 NOTE — ED Provider Notes (Signed)
 [facility]    CSN: [id number] Arrival date & time: 05/25/24  1849      History   Chief Complaint Chief Complaint  Patient presents with   Foot Injury    Entered by patient    HPI Amy Byrd is a 48 y.o. female.   48 year old female presents today with right foot injury.  Reports that she was walking and turned the foot today feeling a pop to the top of the right foot near the fifth metacarpal area.  She is able to ambulate but with some discomfort.  Denies any numbness, tingling in the foot or loss of sensation.   Foot Injury   Past Medical History:  Diagnosis Date   Allergy    Celiac disease    Tachycardia     There are no active problems to display for this patient.   Past Surgical History:  Procedure Laterality Date   COLONOSCOPY  2005   early celiac disease   UPPER GASTROINTESTINAL ENDOSCOPY  2005    OB History   No obstetric history on file.      Home Medications    Prior to Admission medications   Medication Sig Start Date End Date Taking? Authorizing Provider  calcium carbonate (OS-CAL) 1250 (500 Ca) MG chewable tablet Chew 1 tablet by mouth daily.    [provider]  Crisaborole (EUCRISA) 2 % OINT APPLY TO AFFECTED AREA TWICE A DAY FOR 2 WEEKS 08/29/18   [provider]  metoprolol succinate (TOPROL-XL) 25 MG 24 hr tablet Take 1 tablet by mouth daily. 07/08/20   [provider]  Multiple Vitamin (MULTIVITAMIN) tablet Take 1 tablet by mouth daily.    [provider]    Family History Family History  Problem Relation Age of Onset   Breast cancer Maternal Aunt    Colon cancer Neg Hx    Colon polyps Neg Hx    Esophageal cancer Neg Hx    Rectal cancer Neg Hx    Stomach cancer Neg Hx     Social History Social History   Tobacco Use   Smoking status: Never   Smokeless tobacco: Never  Vaping Use   Vaping status: Never Used  Substance Use Topics   Drug use: Never     Allergies    Septra [sulfamethoxazole-trimethoprim]   Review of Systems Review of Systems See HPI  Physical Exam Triage Vital Signs ED Triage Vitals  Encounter Vitals Group     BP 05/25/24 1906 108/66     Girls Systolic BP Percentile --      Girls Diastolic BP Percentile --      Boys Systolic BP Percentile --      Boys Diastolic BP Percentile --      Pulse Rate 05/25/24 1906 75     Resp 05/25/24 1906 20     Temp 05/25/24 1906 98.3 F (36.8 C)     Temp Source 05/25/24 1906 Oral     SpO2 05/25/24 1906 99 %     Weight --      Height --      Head Circumference --      Peak Flow --      Pain Score 05/25/24 1910 5     Pain Loc --      Pain Education --      Exclude from Growth Chart --    No data found.  Updated Vital Signs BP 108/66 (BP Location: Right Arm)   Pulse 75  Temp 98.3 F (36.8 C) (Oral)   Resp 20   SpO2 99%   Visual Acuity Right Eye Distance:   Left Eye Distance:   Bilateral Distance:    Right Eye Near:   Left Eye Near:    Bilateral Near:     Physical Exam Vitals and nursing note reviewed.  Constitutional:      General: She is not in acute distress.    Appearance: Normal appearance. She is not ill-appearing, toxic-appearing or diaphoretic.  Pulmonary:     Effort: Pulmonary effort is normal.   Musculoskeletal:        General: Swelling and tenderness present.     Comments: Mild swelling and tenderness to the base of the fifth metatarsal. Normal range of motion and 2+ pedal pulse.  Sensation intact.   Neurological:     Mental Status: She is alert.   Psychiatric:        Mood and Affect: Mood normal.      UC Treatments / Results  Labs (all labs ordered are listed, but only abnormal results are displayed) Labs Reviewed - No data to display  EKG   Radiology DG Foot Complete Right Result Date: 05/25/2024 CLINICAL DATA:  Twisted right foot wall turning today. Pain to top of right foot. EXAM: RIGHT FOOT COMPLETE - 3+ VIEW COMPARISON:  None  Available. FINDINGS: Linear lucency at the base of the fifth metatarsal is only seen on lateral view. Projectional artifact versus nondisplaced fracture. Correlate with site of pain. No dislocation. There is no evidence of arthropathy or other focal bone abnormality. Soft tissues are unremarkable. IMPRESSION: Linear lucency at the base of the fifth metatarsal is only seen on lateral view. Projectional artifact versus nondisplaced fracture. Correlate with site of pain. Electronically Signed   By: Rozell Cornet M.D.   On: 05/25/2024 20:00    Procedures Procedures (including critical care time)  Medications Ordered in UC Medications - No data to display  Initial Impression / Assessment and Plan / UC Course  I have reviewed the triage vital signs and the nursing notes.  Pertinent labs & imaging results that were available during my care of the patient were reviewed by me and considered in my medical decision making (see chart for details).     Right foot injury-after reviewing x-ray final results radiologist read Linear lucency at the base of the fifth metatarsal is only seen on lateral view. Projectional artifact versus nondisplaced fracture. Correlate with site of pain. No dislocation. There is no evidence of arthropathy or other focal bone abnormality. Soft tissues are unremarkable. Attempted to call patient to relay results.  Will keep trying to call.  Recommendations would be to follow-up with orthopedic for further management of this injury. Did Ace wrap prior to discharge yesterday and recommended rest, ice and staying off the foot is much as possible. Final Clinical Impressions(s) / UC Diagnoses   Final diagnoses:  Injury of right foot, initial encounter     Discharge Instructions      I am not seeing anything concerning on your x-ray. Most likely sprain.  You can use the Ace wrap for compression.,  Rest, ice, elevate and follow-up as needed I will call you if anything  changes on the official results of your x-ray    ED Prescriptions   None    PDMP not reviewed this encounter.   [name], FNP 05/26/24 1048

## 2024-06-22 ENCOUNTER — Ambulatory Visit
Admission: RE | Admit: 2024-06-22 | Discharge: 2024-06-22 | Disposition: A | Discharge status: Home / Self Care | Payer: Self-pay | Source: Ambulatory Visit | Attending: Obstetrics and Gynecology | Admitting: Obstetrics and Gynecology

## 2024-06-22 DIAGNOSIS — Z1231 Encounter for screening mammogram for malignant neoplasm of breast: Secondary | ICD-10-CM

## 2024-09-26 ENCOUNTER — Other Ambulatory Visit: Payer: Self-pay | Admitting: Obstetrics and Gynecology

## 2024-09-28 LAB — SURGICAL PATHOLOGY
